# Patient Record
Sex: Male | Born: 1970 | Race: Black or African American | Hispanic: Yes | Marital: Single | Smoking: Never smoker
Health system: Southern US, Community
[De-identification: ages and names within clinical notes are randomized; demographics above are authoritative.]

## PROBLEM LIST (undated history)

## (undated) DIAGNOSIS — E7800 Pure hypercholesterolemia, unspecified: Secondary | ICD-10-CM

## (undated) DIAGNOSIS — E119 Type 2 diabetes mellitus without complications: Secondary | ICD-10-CM

---

## 2012-09-07 ENCOUNTER — Encounter (HOSPITAL_COMMUNITY): Payer: Self-pay | Admitting: Emergency Medicine

## 2012-09-07 ENCOUNTER — Emergency Department (HOSPITAL_COMMUNITY)
Admission: EM | Admit: 2012-09-07 | Discharge: 2012-09-08 | Disposition: A | Discharge status: Home / Self Care | Payer: Self-pay | Attending: Emergency Medicine | Admitting: Emergency Medicine

## 2012-09-07 ENCOUNTER — Emergency Department (HOSPITAL_COMMUNITY): Payer: Self-pay

## 2012-09-07 DIAGNOSIS — R079 Chest pain, unspecified: Secondary | ICD-10-CM | POA: Insufficient documentation

## 2012-09-07 DIAGNOSIS — Z880 Allergy status to penicillin: Secondary | ICD-10-CM | POA: Insufficient documentation

## 2012-09-07 DIAGNOSIS — E785 Hyperlipidemia, unspecified: Secondary | ICD-10-CM | POA: Insufficient documentation

## 2012-09-07 DIAGNOSIS — R202 Paresthesia of skin: Secondary | ICD-10-CM

## 2012-09-07 DIAGNOSIS — R209 Unspecified disturbances of skin sensation: Secondary | ICD-10-CM | POA: Insufficient documentation

## 2012-09-07 HISTORY — DX: Pure hypercholesterolemia, unspecified: E78.00

## 2012-09-07 LAB — CBC WITH DIFFERENTIAL/PLATELET
Eosinophils Absolute: 0.2 10*3/uL (ref 0.0–0.7)
Hemoglobin: 15.7 g/dL (ref 13.0–17.0)
Lymphocytes Relative: 36 % (ref 12–46)
Lymphs Abs: 3.7 10*3/uL (ref 0.7–4.0)
MCH: 31.2 pg (ref 26.0–34.0)
Monocytes Relative: 8 % (ref 3–12)
Neutro Abs: 5.6 10*3/uL (ref 1.7–7.7)
Neutrophils Relative %: 54 % (ref 43–77)
Platelets: 223 10*3/uL (ref 150–400)
RBC: 5.04 MIL/uL (ref 4.22–5.81)
WBC: 10.4 10*3/uL (ref 4.0–10.5)

## 2012-09-07 LAB — COMPREHENSIVE METABOLIC PANEL
AST: 18 U/L (ref 0–37)
BUN: 19 mg/dL (ref 6–23)
CO2: 28 mEq/L (ref 19–32)
Calcium: 9.8 mg/dL (ref 8.4–10.5)
Chloride: 102 mEq/L (ref 96–112)
Creatinine, Ser: 1.32 mg/dL (ref 0.50–1.35)
GFR calc Af Amer: 78 mL/min — ABNORMAL LOW (ref >90)
GFR calc non Af Amer: 67 mL/min — ABNORMAL LOW (ref >90)
Total Bilirubin: 0.3 mg/dL (ref 0.3–1.2)

## 2012-09-07 LAB — POCT I-STAT TROPONIN I: Troponin i, poc: 0 ng/mL (ref 0.00–0.08)

## 2012-09-07 NOTE — ED Notes (Addendum)
PT. REPORTS MID CHEST PAIN WITH BODY ACHES , NAUSEA AND HEADACHE FOR SEVERAL DAYS , DENIES SOB OR COUGH. SISTER TRANSLATING ( SPANISH) FOR PT.

## 2012-09-07 NOTE — ED Notes (Signed)
Family at bedside. Sister and family friend at beside. Pt does not speak [place]. Interpreter needed. Sister has been able to relay information for pt thus far. Pt states that his chest has been hurting for 6 days. Pain is sharp, non radiating, located on the L. Pt denies any SOB or diaphoresis, however he has had nausea with no vomiting. Pain is intermittent. Pt has not been able to sleep at night, due to pain. Pt currently rates pain as 4/10.

## 2012-09-08 NOTE — ED Provider Notes (Signed)
History     CSN: [id number]  Arrival date & time 09/07/12  2302   First MD Initiated Contact with Patient 09/07/12 2348      Chief Complaint  Patient presents with  . Chest Pain    (Consider location/radiation/quality/duration/timing/severity/associated sxs/prior treatment) HPI Comments: 41 year old male with a history of hyperlipidemia presents emergency department with the complaint of "numbness in my brain, hands, and feet and throbbing in my chest".  Patient has been suffering from this pain intermittently over the ear or but it has progressively worsened over the last month becoming increasingly concerning on Saturday.  Patient states the numbness in his hands, feet and brain lasts approximately 3-4 hours before it goes away. These episodes are happening more often and are associated with sugar consumption. Pt denies being a diabetic, recent trauma, CP, SOB, PND, leg swelling, weakness of extremities, neck or back pain.   Patient is a 41 y.o. male presenting with chest pain.  Chest Pain Pertinent negatives for primary symptoms include no fever, no shortness of breath, no abdominal pain and no dizziness.  Associated symptoms include numbness.  Pertinent negatives for associated symptoms include no weakness.     Past Medical History  Diagnosis Date  . Hypercholesterolemia     History reviewed. No pertinent past surgical history.  No family history on file.  History  Substance Use Topics  . Smoking status: Never Smoker   . Smokeless tobacco: Not on file  . Alcohol Use: No      Review of Systems  Constitutional: Negative for fever, chills and appetite change.  HENT: Negative for congestion.   Eyes: Negative for visual disturbance.  Respiratory: Negative for shortness of breath.   Cardiovascular: Negative for chest pain and leg swelling.  Gastrointestinal: Negative for abdominal pain.  Genitourinary: Negative for dysuria, urgency and frequency.  Neurological:  Positive for numbness. Negative for dizziness, syncope, weakness, light-headedness and headaches.  Psychiatric/Behavioral: Negative for confusion.  All other systems reviewed and are negative.    Allergies  Penicillins  Home Medications  No current outpatient prescriptions on file.  BP 116/80  Pulse 72  Temp 98.8 F (37.1 C) (Oral)  Resp 15  SpO2 99%  Physical Exam  Nursing note and vitals reviewed. Constitutional: He is oriented to person, place, and time. He appears well-developed and well-nourished. No distress.       Patient not in visible distress  HENT:  Head: Normocephalic and atraumatic.  Eyes: Conjunctivae normal and EOM are normal. Pupils are equal, round, and reactive to light.  Neck: Normal range of motion. Neck supple. Normal carotid pulses, no hepatojugular reflux and no JVD present. Carotid bruit is not present.       No carotid bruits  Cardiovascular:       RRR, no aberrancy on auscultation, intact distal pulses no pitting edema bilaterally.  Pulmonary/Chest: Effort normal and breath sounds normal. No respiratory distress.       Lungs clear to auscultation bilaterally  Abdominal: Soft. He exhibits no distension. There is no tenderness.       Nonpulsatile aorta  Musculoskeletal: Normal range of motion. He exhibits no tenderness.  Neurological: He is alert and oriented to person, place, and time. He displays a negative Romberg sign.       Distal sensation intact in all 4 extremities. Cranial nerves III through XII intact, normal coordination, negative Romberg, strength 5/5 bilaterally. No ataxia  Skin: Skin is warm and dry. No pallor.  Psychiatric: He has a normal mood  and affect. His behavior is normal.    ED Course  Procedures (including critical care time)  Labs Reviewed  COMPREHENSIVE METABOLIC PANEL - Abnormal; Notable for the following:    Glucose, Bld 100 (*)     GFR calc non Af Amer 67 (*)     GFR calc Af Amer 78 (*)     All other components  within normal limits  CBC WITH DIFFERENTIAL  POCT I-STAT TROPONIN I   Dg Chest 2 View  09/07/2012  *RADIOLOGY REPORT*  Clinical Data: Chest pain for 6 days.  CHEST - 2 VIEW  Comparison: None.  Findings: Lungs are clear.  Heart size is normal.  No pneumothorax or pleural fluid.  No focal bony abnormality.  IMPRESSION: No acute disease.   Original Report Authenticated By: Bernadene Bell. Maricela Curet, M.D.      No diagnosis found.    MDM  Neuropathy   41 yo to ER with intermittent brain, hand & feet numbness that has progressed over the last month. He Is currently asymptomatic. Pt with normal vitals and in NAD. Labs and imaging reviewed. NO focal neuro deficits on exam.  At this time there does not appear to be any evidence of an acute emergency medical condition  Or acute reason for CT imaging and the patient appears stable for discharge with appropriate outpatient follow up. Diagnosis was discussed with patient who verbalizes understanding and is agreeable to discharge. Recommended evaluation by a PCP- guide given at dc.       Jaci Carrel, [place] 09/08/12 [phone]

## 2012-09-08 NOTE — ED Provider Notes (Signed)
Medical screening examination/treatment/procedure(s) were performed by non-physician practitioner and as supervising physician I was immediately available for consultation/collaboration.  Rhonda Vangieson, MD 09/08/12 0320 

## 2016-01-25 ENCOUNTER — Emergency Department (HOSPITAL_COMMUNITY): Payer: Self-pay

## 2016-01-25 ENCOUNTER — Encounter (HOSPITAL_COMMUNITY): Payer: Self-pay | Admitting: Physical Medicine and Rehabilitation

## 2016-01-25 ENCOUNTER — Emergency Department (HOSPITAL_COMMUNITY)
Admission: EM | Admit: 2016-01-25 | Discharge: 2016-01-25 | Disposition: A | Discharge status: Home / Self Care | Payer: Self-pay | Attending: Emergency Medicine | Admitting: Emergency Medicine

## 2016-01-25 DIAGNOSIS — Z23 Encounter for immunization: Secondary | ICD-10-CM | POA: Insufficient documentation

## 2016-01-25 DIAGNOSIS — Y9389 Activity, other specified: Secondary | ICD-10-CM | POA: Insufficient documentation

## 2016-01-25 DIAGNOSIS — Y92410 Unspecified street and highway as the place of occurrence of the external cause: Secondary | ICD-10-CM | POA: Insufficient documentation

## 2016-01-25 DIAGNOSIS — S0081XA Abrasion of other part of head, initial encounter: Secondary | ICD-10-CM | POA: Insufficient documentation

## 2016-01-25 DIAGNOSIS — Z8639 Personal history of other endocrine, nutritional and metabolic disease: Secondary | ICD-10-CM | POA: Insufficient documentation

## 2016-01-25 DIAGNOSIS — S3991XA Unspecified injury of abdomen, initial encounter: Secondary | ICD-10-CM | POA: Insufficient documentation

## 2016-01-25 DIAGNOSIS — E119 Type 2 diabetes mellitus without complications: Secondary | ICD-10-CM | POA: Insufficient documentation

## 2016-01-25 DIAGNOSIS — S299XXA Unspecified injury of thorax, initial encounter: Secondary | ICD-10-CM | POA: Insufficient documentation

## 2016-01-25 DIAGNOSIS — T07XXXA Unspecified multiple injuries, initial encounter: Secondary | ICD-10-CM

## 2016-01-25 DIAGNOSIS — Y999 Unspecified external cause status: Secondary | ICD-10-CM | POA: Insufficient documentation

## 2016-01-25 DIAGNOSIS — S199XXA Unspecified injury of neck, initial encounter: Secondary | ICD-10-CM | POA: Insufficient documentation

## 2016-01-25 DIAGNOSIS — Z880 Allergy status to penicillin: Secondary | ICD-10-CM | POA: Insufficient documentation

## 2016-01-25 HISTORY — DX: Type 2 diabetes mellitus without complications: E11.9

## 2016-01-25 LAB — I-STAT CHEM 8, ED
BUN: 18 mg/dL (ref 6–20)
CALCIUM ION: 1.11 mmol/L — AB (ref 1.12–1.23)
CHLORIDE: 104 mmol/L (ref 101–111)
Creatinine, Ser: 0.8 mg/dL (ref 0.61–1.24)
Glucose, Bld: 115 mg/dL — ABNORMAL HIGH (ref 65–99)
HCT: 47 % (ref 39.0–52.0)
Hemoglobin: 16 g/dL (ref 13.0–17.0)
POTASSIUM: 3.5 mmol/L (ref 3.5–5.1)
SODIUM: 140 mmol/L (ref 135–145)
TCO2: 24 mmol/L (ref 0–100)

## 2016-01-25 LAB — COMPREHENSIVE METABOLIC PANEL
ALBUMIN: 3.9 g/dL (ref 3.5–5.0)
ALK PHOS: 71 U/L (ref 38–126)
ALT: 22 U/L (ref 17–63)
ANION GAP: 10 (ref 5–15)
AST: 24 U/L (ref 15–41)
BILIRUBIN TOTAL: 0.7 mg/dL (ref 0.3–1.2)
BUN: 15 mg/dL (ref 6–20)
CALCIUM: 8.9 mg/dL (ref 8.9–10.3)
CHLORIDE: 101 mmol/L (ref 101–111)
CO2: 26 mmol/L (ref 22–32)
CREATININE: 0.89 mg/dL (ref 0.61–1.24)
GFR calc Af Amer: >60 mL/min (ref >60)
GFR calc non Af Amer: >60 mL/min (ref >60)
Glucose, Bld: 124 mg/dL — ABNORMAL HIGH (ref 65–99)
Potassium: 4.2 mmol/L (ref 3.5–5.1)
SODIUM: 137 mmol/L (ref 135–145)
Total Protein: 6.6 g/dL (ref 6.5–8.1)

## 2016-01-25 LAB — PROTIME-INR
INR: 0.93 (ref 0.00–1.49)
Prothrombin Time: 12.7 seconds (ref 11.6–15.2)

## 2016-01-25 LAB — CBC
HCT: 46.3 % (ref 39.0–52.0)
HEMOGLOBIN: 16.2 g/dL (ref 13.0–17.0)
MCH: 31.4 pg (ref 26.0–34.0)
MCHC: 35 g/dL (ref 30.0–36.0)
MCV: 89.7 fL (ref 78.0–100.0)
PLATELETS: 242 10*3/uL (ref 150–400)
RBC: 5.16 MIL/uL (ref 4.22–5.81)
RDW: 13.5 % (ref 11.5–15.5)
WBC: 14.5 10*3/uL — AB (ref 4.0–10.5)

## 2016-01-25 LAB — SAMPLE TO BLOOD BANK

## 2016-01-25 LAB — ETHANOL

## 2016-01-25 MED ORDER — IBUPROFEN 800 MG PO TABS: 800.0000 mg | ORAL_TABLET | Freq: Three times a day (TID) | ORAL | Status: AC

## 2016-01-25 MED ORDER — SODIUM CHLORIDE 0.9 % IV SOLN
1000.0000 mL | Freq: Once | INTRAVENOUS | Status: AC
  Administered 2016-01-25: 1000 mL via INTRAVENOUS

## 2016-01-25 MED ORDER — HYDROCODONE-ACETAMINOPHEN 5-325 MG PO TABS: 1.0000 | ORAL_TABLET | ORAL | Status: AC | PRN

## 2016-01-25 MED ORDER — IOHEXOL 300 MG/ML  SOLN
100.0000 mL | Freq: Once | INTRAMUSCULAR | Status: AC | PRN
  Administered 2016-01-25: 100 mL via INTRAVENOUS

## 2016-01-25 MED ORDER — TETANUS-DIPHTHERIA TOXOIDS TD 5-2 LFU IM INJ
0.5000 mL | INJECTION | Freq: Once | INTRAMUSCULAR | Status: AC
  Administered 2016-01-25: 0.5 mL via INTRAMUSCULAR
  Filled 2016-01-25: qty 0.5

## 2016-01-25 MED ORDER — SODIUM CHLORIDE 0.9 % IV SOLN: 1000.0000 mL | INTRAVENOUS | Status: DC
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
## 2019-08-13 ENCOUNTER — Other Ambulatory Visit: Payer: Self-pay | Admitting: Nurse Practitioner

## 2019-08-13 DIAGNOSIS — R1012 Left upper quadrant pain: Secondary | ICD-10-CM

## 2019-09-04 ENCOUNTER — Other Ambulatory Visit: Payer: Self-pay

## 2019-09-11 ENCOUNTER — Other Ambulatory Visit: Payer: Self-pay
